# Patient Record
Sex: Female | Born: 1945 | Race: Black or African American | Hispanic: No | Marital: Married | Smoking: Never smoker
Health system: Southern US, Community
[De-identification: ages and names within clinical notes are randomized; demographics above are authoritative.]

## PROBLEM LIST (undated history)

## (undated) ENCOUNTER — Ambulatory Visit: Admission: EM | Payer: Medicare Other

## (undated) DIAGNOSIS — E119 Type 2 diabetes mellitus without complications: Secondary | ICD-10-CM

## (undated) DIAGNOSIS — I10 Essential (primary) hypertension: Secondary | ICD-10-CM

## (undated) DIAGNOSIS — H25019 Cortical age-related cataract, unspecified eye: Secondary | ICD-10-CM

## (undated) HISTORY — PX: COLONOSCOPY: SHX174

## (undated) HISTORY — PX: BREAST BIOPSY: SHX20

## (undated) HISTORY — PX: BREAST EXCISIONAL BIOPSY: SUR124

## (undated) HISTORY — PX: ABDOMINAL HYSTERECTOMY: SHX81

---

## 2004-09-06 ENCOUNTER — Ambulatory Visit: Payer: Self-pay | Admitting: Obstetrics and Gynecology

## 2005-10-30 ENCOUNTER — Ambulatory Visit: Payer: Self-pay | Admitting: Obstetrics and Gynecology

## 2005-11-14 ENCOUNTER — Ambulatory Visit: Payer: Self-pay | Admitting: Gastroenterology

## 2006-11-04 ENCOUNTER — Ambulatory Visit: Payer: Self-pay | Admitting: Obstetrics and Gynecology

## 2006-11-06 ENCOUNTER — Ambulatory Visit: Payer: Self-pay | Admitting: Obstetrics and Gynecology

## 2007-11-05 ENCOUNTER — Ambulatory Visit: Payer: Self-pay | Admitting: Obstetrics and Gynecology

## 2008-11-10 ENCOUNTER — Ambulatory Visit: Payer: Self-pay

## 2009-11-13 ENCOUNTER — Ambulatory Visit: Payer: Self-pay

## 2010-11-26 ENCOUNTER — Ambulatory Visit: Payer: Self-pay

## 2011-11-25 ENCOUNTER — Emergency Department: Payer: Self-pay | Admitting: Emergency Medicine

## 2011-11-25 LAB — COMPREHENSIVE METABOLIC PANEL
Albumin: 3.5 g/dL (ref 3.4–5.0)
Alkaline Phosphatase: 63 U/L (ref 50–136)
Anion Gap: 8 (ref 7–16)
Calcium, Total: 9.2 mg/dL (ref 8.5–10.1)
Chloride: 104 mmol/L (ref 98–107)
Co2: 30 mmol/L (ref 21–32)
Creatinine: 0.87 mg/dL (ref 0.60–1.30)
Glucose: 84 mg/dL (ref 65–99)
Osmolality: 284 (ref 275–301)
SGOT(AST): 21 U/L (ref 15–37)
SGPT (ALT): 20 U/L (ref 12–78)
Sodium: 142 mmol/L (ref 136–145)

## 2011-11-25 LAB — URINALYSIS, COMPLETE
Bilirubin,UR: NEGATIVE
Blood: NEGATIVE
Glucose,UR: NEGATIVE mg/dL (ref 0–75)
Ketone: NEGATIVE
Nitrite: NEGATIVE
Protein: NEGATIVE
RBC,UR: 5 /HPF (ref 0–5)
Specific Gravity: 1.017 (ref 1.003–1.030)
WBC UR: 43 /HPF (ref 0–5)

## 2011-11-25 LAB — CBC
HCT: 37.4 % (ref 35.0–47.0)
HGB: 12.6 g/dL (ref 12.0–16.0)
MCH: 31 pg (ref 26.0–34.0)
MCHC: 33.7 g/dL (ref 32.0–36.0)
MCV: 92 fL (ref 80–100)
RDW: 13.3 % (ref 11.5–14.5)

## 2011-11-25 LAB — CK TOTAL AND CKMB (NOT AT ARMC): CK-MB: <0.5 ng/mL — ABNORMAL LOW (ref 0.5–3.6)

## 2011-11-27 ENCOUNTER — Ambulatory Visit: Payer: Self-pay

## 2011-11-27 LAB — URINE CULTURE

## 2011-12-16 ENCOUNTER — Ambulatory Visit: Payer: Self-pay | Admitting: Physician Assistant

## 2012-01-05 ENCOUNTER — Ambulatory Visit: Payer: Self-pay | Admitting: Physician Assistant

## 2012-12-03 ENCOUNTER — Ambulatory Visit: Payer: Self-pay

## 2012-12-24 ENCOUNTER — Ambulatory Visit: Payer: Self-pay

## 2013-06-24 ENCOUNTER — Ambulatory Visit: Payer: Self-pay

## 2013-12-20 ENCOUNTER — Ambulatory Visit: Payer: Self-pay

## 2014-01-03 ENCOUNTER — Ambulatory Visit: Payer: Self-pay

## 2014-11-17 ENCOUNTER — Other Ambulatory Visit: Payer: Self-pay | Admitting: Obstetrics and Gynecology

## 2014-11-17 DIAGNOSIS — Z1231 Encounter for screening mammogram for malignant neoplasm of breast: Secondary | ICD-10-CM

## 2014-12-27 ENCOUNTER — Other Ambulatory Visit: Payer: Self-pay | Admitting: Obstetrics and Gynecology

## 2014-12-27 ENCOUNTER — Ambulatory Visit
Admission: RE | Admit: 2014-12-27 | Discharge: 2014-12-27 | Disposition: A | Discharge status: Home / Self Care | Payer: Medicare Other | Source: Ambulatory Visit | Attending: Obstetrics and Gynecology | Admitting: Obstetrics and Gynecology

## 2014-12-27 DIAGNOSIS — Z1231 Encounter for screening mammogram for malignant neoplasm of breast: Secondary | ICD-10-CM

## 2015-11-30 ENCOUNTER — Other Ambulatory Visit: Payer: Self-pay | Admitting: Obstetrics and Gynecology

## 2015-11-30 DIAGNOSIS — Z1231 Encounter for screening mammogram for malignant neoplasm of breast: Secondary | ICD-10-CM

## 2016-01-04 ENCOUNTER — Ambulatory Visit
Admission: RE | Admit: 2016-01-04 | Discharge: 2016-01-04 | Disposition: A | Discharge status: Home / Self Care | Payer: Medicare Other | Source: Ambulatory Visit | Attending: Obstetrics and Gynecology | Admitting: Obstetrics and Gynecology

## 2016-01-04 DIAGNOSIS — Z1231 Encounter for screening mammogram for malignant neoplasm of breast: Secondary | ICD-10-CM | POA: Insufficient documentation

## 2016-04-26 ENCOUNTER — Other Ambulatory Visit: Payer: Self-pay | Admitting: Family Medicine

## 2016-04-26 DIAGNOSIS — Z780 Asymptomatic menopausal state: Secondary | ICD-10-CM

## 2016-07-10 ENCOUNTER — Encounter: Payer: Self-pay | Admitting: *Deleted

## 2016-07-11 ENCOUNTER — Ambulatory Visit: Payer: Medicare Other | Admitting: Anesthesiology

## 2016-07-11 ENCOUNTER — Ambulatory Visit
Admission: RE | Admit: 2016-07-11 | Discharge: 2016-07-11 | Disposition: A | Discharge status: Home / Self Care | Payer: Medicare Other | Source: Ambulatory Visit | Attending: Gastroenterology | Admitting: Gastroenterology

## 2016-07-11 ENCOUNTER — Encounter
Admission: RE | Disposition: A | Discharge status: Home / Self Care | Payer: Self-pay | Source: Ambulatory Visit | Attending: Gastroenterology

## 2016-07-11 DIAGNOSIS — Z7982 Long term (current) use of aspirin: Secondary | ICD-10-CM | POA: Diagnosis not present

## 2016-07-11 DIAGNOSIS — Z888 Allergy status to other drugs, medicaments and biological substances status: Secondary | ICD-10-CM | POA: Insufficient documentation

## 2016-07-11 DIAGNOSIS — Z79899 Other long term (current) drug therapy: Secondary | ICD-10-CM | POA: Diagnosis not present

## 2016-07-11 DIAGNOSIS — I10 Essential (primary) hypertension: Secondary | ICD-10-CM | POA: Diagnosis not present

## 2016-07-11 DIAGNOSIS — E669 Obesity, unspecified: Secondary | ICD-10-CM | POA: Diagnosis not present

## 2016-07-11 DIAGNOSIS — Z1211 Encounter for screening for malignant neoplasm of colon: Secondary | ICD-10-CM | POA: Insufficient documentation

## 2016-07-11 DIAGNOSIS — E119 Type 2 diabetes mellitus without complications: Secondary | ICD-10-CM | POA: Insufficient documentation

## 2016-07-11 DIAGNOSIS — K5730 Diverticulosis of large intestine without perforation or abscess without bleeding: Secondary | ICD-10-CM | POA: Diagnosis not present

## 2016-07-11 DIAGNOSIS — Z882 Allergy status to sulfonamides status: Secondary | ICD-10-CM | POA: Insufficient documentation

## 2016-07-11 DIAGNOSIS — Z6831 Body mass index (BMI) 31.0-31.9, adult: Secondary | ICD-10-CM | POA: Insufficient documentation

## 2016-07-11 HISTORY — DX: Essential (primary) hypertension: I10

## 2016-07-11 HISTORY — DX: Type 2 diabetes mellitus without complications: E11.9

## 2016-07-11 HISTORY — DX: Cortical age-related cataract, unspecified eye: H25.019

## 2016-07-11 HISTORY — PX: COLONOSCOPY WITH PROPOFOL: SHX5780

## 2016-07-11 LAB — GLUCOSE, CAPILLARY: GLUCOSE-CAPILLARY: 125 mg/dL — AB (ref 65–99)

## 2016-07-11 SURGERY — COLONOSCOPY WITH PROPOFOL
Anesthesia: General

## 2016-07-11 MED ORDER — PROPOFOL 500 MG/50ML IV EMUL
INTRAVENOUS | Status: AC
  Filled 2016-07-11: qty 50

## 2016-07-11 MED ORDER — LIDOCAINE HCL (CARDIAC) 20 MG/ML IV SOLN
INTRAVENOUS | Status: DC | PRN
  Administered 2016-07-11: 2 mL via INTRAVENOUS

## 2016-07-11 MED ORDER — SODIUM CHLORIDE 0.9 % IV SOLN: INTRAVENOUS | Status: DC

## 2016-07-11 MED ORDER — PROPOFOL 500 MG/50ML IV EMUL
INTRAVENOUS | Status: DC | PRN
Start: 2016-07-11 — End: 2016-07-11
  Administered 2016-07-11: 120 ug/kg/min via INTRAVENOUS

## 2016-07-11 MED ORDER — SODIUM CHLORIDE 0.9 % IV SOLN
INTRAVENOUS | Status: DC
  Administered 2016-07-11: 1000 mL via INTRAVENOUS

## 2016-07-11 MED ORDER — PROPOFOL 10 MG/ML IV BOLUS
INTRAVENOUS | Status: DC | PRN
  Administered 2016-07-11: 20 mg via INTRAVENOUS
  Administered 2016-07-11: 40 mg via INTRAVENOUS
  Administered 2016-07-11: 20 mg via INTRAVENOUS

## 2016-07-11 NOTE — H&P (Signed)
I have discussed the risks benefits and complications of procedures to include not limited to bleeding, infection, perforation and the risk of sedation and the patient wishes to proceed. 

## 2016-07-11 NOTE — Anesthesia Post-op Follow-up Note (Signed)
Anesthesia QCDR form completed.        

## 2016-07-11 NOTE — Anesthesia Preprocedure Evaluation (Signed)
Anesthesia Evaluation  Patient identified by MRN, date of birth, ID band Patient awake    Reviewed: Allergy & Precautions, NPO status , Patient's Chart, lab work & pertinent test results  Airway Mallampati: II       Dental  (+) Teeth Intact   Pulmonary neg pulmonary ROS,    breath sounds clear to auscultation       Cardiovascular Exercise Tolerance: Good hypertension, Pt. on medications  Rhythm:Regular     Neuro/Psych negative neurological ROS  negative psych ROS   GI/Hepatic negative GI ROS, Neg liver ROS,   Endo/Other  diabetes, Type 2  Renal/GU negative Renal ROS     Musculoskeletal negative musculoskeletal ROS (+)   Abdominal (+) + obese,   Peds negative pediatric ROS (+)  Hematology negative hematology ROS (+)   Anesthesia Other Findings   Reproductive/Obstetrics                             Anesthesia Physical Anesthesia Plan  ASA: II  Anesthesia Plan: General   Post-op Pain Management:    Induction: Intravenous  PONV Risk Score and Plan: 1 and Treatment may vary due to age  Airway Management Planned: Natural Airway  Additional Equipment:   Intra-op Plan:   Post-operative Plan:   Informed Consent: I have reviewed the patients History and Physical, chart, labs and discussed the procedure including the risks, benefits and alternatives for the proposed anesthesia with the patient or authorized representative who has indicated his/her understanding and acceptance.     Plan Discussed with: Surgeon  Anesthesia Plan Comments:         Anesthesia Quick Evaluation

## 2016-07-11 NOTE — Op Note (Signed)
[facility] Gastroenterology Patient Name: Mallory Wilkerson Procedure Date: 07/11/2016 9:06 AM MRN: [id number] Account #: [id number] Date of Birth: 1945-08-27 Admit Type: Outpatient Age: 71 Room: [facility] ENDO ROOM 1 Gender: Female Note Status: Finalized Procedure:            Colonoscopy Indications:          Screening for colorectal malignant neoplasm Providers:            Christena Deem, MD Referring MD:         Nat Christen. Zada Finders, MD (Referring MD) Medicines:            Monitored Anesthesia Care Complications:        No immediate complications. Procedure:            Pre-Anesthesia Assessment:                       - ASA Grade Assessment: II - A patient with mild                        systemic disease.                       After obtaining informed consent, the colonoscope was                        passed under direct vision. Throughout the procedure,                        the patient's blood pressure, pulse, and oxygen                        saturations were monitored continuously. The                        Colonoscope was introduced through the anus and                        advanced to the the cecum, identified by appendiceal                        orifice and ileocecal valve. The colonoscopy was                        performed with moderate difficulty due to significant                        looping. Successful completion of the procedure was                        aided by changing the patient to a supine position,                        changing the patient to a prone position, using manual                        pressure and changing endoscopes. The patient tolerated                        the procedure well. The quality of the bowel  preparation was good. Findings:      Multiple small and large-mouthed diverticula were found in the sigmoid       colon, descending colon, transverse colon and ascending colon.      The digital rectal  exam was normal.      The retroflexed view of the distal rectum and anal verge was normal and       showed no anal or rectal abnormalities. Impression:           - Diverticulosis in the sigmoid colon, in the                        descending colon, in the transverse colon and in the                        ascending colon.                       - No specimens collected. Recommendation:       - Discharge patient to home.                       - Repeat colonoscopy in 10 years for screening purposes. Procedure Code(s):    --- Professional ---                       [phone], Colonoscopy, flexible; diagnostic, including                        collection of specimen(s) by brushing or washing, when                        performed (separate procedure) Diagnosis Code(s):    --- Professional ---                       Z12.11, Encounter for screening for malignant neoplasm                        of colon                       K57.30, Diverticulosis of large intestine without                        perforation or abscess without bleeding CPT copyright 2016 American [employer]. All rights reserved. The codes documented in this report are preliminary and upon coder review may  be revised to meet current compliance requirements. Christena DeemMartin U Skulskie, MD 07/11/2016 9:59:00 AM This report has been signed electronically. Number of Addenda: 0 Note Initiated On: 07/11/2016 9:06 AM Scope Withdrawal Time: 0 hours 6 minutes 31 seconds  Total Procedure Duration: 0 hours 34 minutes 40 seconds       [facility]

## 2016-07-11 NOTE — H&P (Signed)
Outpatient short stay form Pre-procedure 07/11/2016 9:14 AM Mallory Wilkerson Cincere Zorn MD  Primary Physician: Dr. Rolin BarryMario Olmedo  Reason for visit:  Colonoscopy  History of present illness:  Patient is a 71 year old female presenting today as above. She tolerated her prep prep for colonoscopy well. She does take 81 mg aspirin but has held that for several days. She takes no other aspirin products or blood thinning agents. Colonoscopy was about 10 years ago.    Current Facility-Administered Medications:  .  0.9 %  sodium chloride infusion, , Intravenous, Continuous, Mallory DeemSkulskie, Kees Idrovo U, MD, Last Rate: 20 mL/hr at 07/11/16 0803, 1,000 mL at 07/11/16 0803 .  0.9 %  sodium chloride infusion, , Intravenous, Continuous, Mallory DeemSkulskie, Tamanika Heiney U, MD  Prescriptions Prior to Admission  Medication Sig Dispense Refill Last Dose  . amLODipine (NORVASC) 5 MG tablet Take 5 mg by mouth daily.   07/11/2016 at 0600  . aspirin EC 81 MG tablet Take 81 mg by mouth daily.   Past Week at Unknown time  . calcium citrate-vitamin D (CITRACAL+D) 315-200 MG-UNIT tablet Take 1 tablet by mouth daily.   Past Week at Unknown time  . chlorthalidone (HYGROTON) 25 MG tablet Take 25 mg by mouth daily.   07/11/2016 at 0600  . ibuprofen (ADVIL,MOTRIN) 200 MG tablet Take 200 mg by mouth every 6 (six) hours as needed.   Past Week at Unknown time  . losartan (COZAAR) 50 MG tablet Take 50 mg by mouth daily.   07/11/2016 at 0600  . magnesium oxide (MAG-OX) 400 MG tablet Take 400 mg by mouth daily.   Past Week at Unknown time  . Multiple Vitamin (MULTIVITAMIN) tablet Take 1 tablet by mouth daily.   Past Week at Unknown time  . potassium chloride SA (K-DUR,KLOR-CON) 20 MEQ tablet Take 20 mEq by mouth 2 (two) times daily.     Marland Kitchen. ammonium lactate (AMLACTIN) 12 % cream Apply topically 2 (two) times daily.   Not Taking at Unknown time  . levalbuterol (XOPENEX HFA) 45 MCG/ACT inhaler Inhale 2 puffs into the lungs every 4 (four) hours as needed for wheezing.   Not  Taking at Unknown time     Allergies  Allergen Reactions  . Statins     hmg-coa reductase inhibitors  . Sulfur      Past Medical History:  Diagnosis Date  . Cataract cortical, senile   . Diabetes mellitus without complication (HCC)   . Hypertension     Review of systems:      Physical Exam    Heart and lungs: Regular rate and rhythm without rub or gallop, lungs are bilaterally clear.    HEENT: Norm cephalic atraumatic eyes are anicteric    Other:     Pertinant exam for procedure: Soft nontender nondistended bowel sounds positive normoactive.    Planned proceedures: Colonoscopy and indicated procedures. I have discussed the risks benefits and complications of procedures to include not limited to bleeding, infection, perforation and the risk of sedation and the patient wishes to proceed.    Mallory Wilkerson Mallory Mitchum, MD Gastroenterology 07/11/2016  9:14 AM

## 2016-07-11 NOTE — Transfer of Care (Signed)
Immediate Anesthesia Transfer of Care Note  Patient: Nobie PutnamDiane Crisp Lueras  Procedure(s) Performed: Procedure(s): COLONOSCOPY WITH PROPOFOL (N/A)  Patient Location: PACU  Anesthesia Type:General  Level of Consciousness: awake  Airway & Oxygen Therapy: Patient Spontanous Breathing and Patient connected to nasal cannula oxygen  Post-op Assessment: Report given to RN and Post -op Vital signs reviewed and stable  Post vital signs: Reviewed  Last Vitals:  Vitals:   07/11/16 0754  BP: (!) 129/59  Pulse: 91  Resp: 16  Temp: 36.3 C    Last Pain:  Vitals:   07/11/16 0754  TempSrc: Tympanic         Complications: No apparent anesthesia complications

## 2016-07-11 NOTE — Anesthesia Postprocedure Evaluation (Signed)
Anesthesia Post Note  Patient: Mallory PutnamDiane Crisp Wilkerson  Procedure(s) Performed: Procedure(s) (LRB): COLONOSCOPY WITH PROPOFOL (N/A)  Patient location during evaluation: PACU Anesthesia Type: General Level of consciousness: awake Pain management: pain level controlled Vital Signs Assessment: post-procedure vital signs reviewed and stable Cardiovascular status: stable Anesthetic complications: no     Last Vitals:  Vitals:   07/11/16 1020 07/11/16 1030  BP: 124/68 138/67  Pulse: 74 74  Resp: 17 16  Temp:      Last Pain:  Vitals:   07/11/16 0959  TempSrc: Tympanic                 VAN STAVEREN,[name]

## 2016-07-12 ENCOUNTER — Encounter: Payer: Self-pay | Admitting: Gastroenterology

## 2016-12-02 ENCOUNTER — Other Ambulatory Visit: Payer: Self-pay | Admitting: Family Medicine

## 2016-12-02 DIAGNOSIS — Z1231 Encounter for screening mammogram for malignant neoplasm of breast: Secondary | ICD-10-CM

## 2017-01-06 ENCOUNTER — Ambulatory Visit
Admission: RE | Admit: 2017-01-06 | Discharge: 2017-01-06 | Disposition: A | Discharge status: Home / Self Care | Payer: Medicare Other | Source: Ambulatory Visit | Attending: Family Medicine | Admitting: Family Medicine

## 2017-01-06 DIAGNOSIS — Z1231 Encounter for screening mammogram for malignant neoplasm of breast: Secondary | ICD-10-CM

## 2017-12-02 ENCOUNTER — Other Ambulatory Visit: Payer: Self-pay | Admitting: Family Medicine

## 2017-12-02 DIAGNOSIS — Z1231 Encounter for screening mammogram for malignant neoplasm of breast: Secondary | ICD-10-CM

## 2018-01-13 ENCOUNTER — Ambulatory Visit
Admission: RE | Admit: 2018-01-13 | Discharge: 2018-01-13 | Disposition: A | Discharge status: Home / Self Care | Payer: Medicare Other | Source: Ambulatory Visit | Attending: Family Medicine | Admitting: Family Medicine

## 2018-01-13 DIAGNOSIS — Z1231 Encounter for screening mammogram for malignant neoplasm of breast: Secondary | ICD-10-CM | POA: Diagnosis present

## 2018-03-04 ENCOUNTER — Other Ambulatory Visit: Payer: Self-pay | Admitting: Family Medicine

## 2018-03-04 DIAGNOSIS — Z780 Asymptomatic menopausal state: Secondary | ICD-10-CM

## 2018-12-14 ENCOUNTER — Other Ambulatory Visit: Payer: Self-pay | Admitting: Family Medicine

## 2018-12-14 DIAGNOSIS — Z1231 Encounter for screening mammogram for malignant neoplasm of breast: Secondary | ICD-10-CM

## 2019-01-19 ENCOUNTER — Ambulatory Visit
Admission: RE | Admit: 2019-01-19 | Discharge: 2019-01-19 | Disposition: A | Discharge status: Home / Self Care | Payer: Medicare Other | Source: Ambulatory Visit | Attending: Family Medicine | Admitting: Family Medicine

## 2019-01-19 DIAGNOSIS — Z780 Asymptomatic menopausal state: Secondary | ICD-10-CM | POA: Insufficient documentation

## 2019-01-19 DIAGNOSIS — Z1231 Encounter for screening mammogram for malignant neoplasm of breast: Secondary | ICD-10-CM | POA: Insufficient documentation

## 2019-04-29 IMAGING — MG DIGITAL SCREENING BILATERAL MAMMOGRAM WITH TOMO AND CAD
8 series · 8 of 24 positions shown · non-contrast
Comparison: Previous exam(s).

CLINICAL DATA: Screening.

EXAM:
DIGITAL SCREENING BILATERAL MAMMOGRAM WITH TOMO AND CAD

[L CC synth-2D]
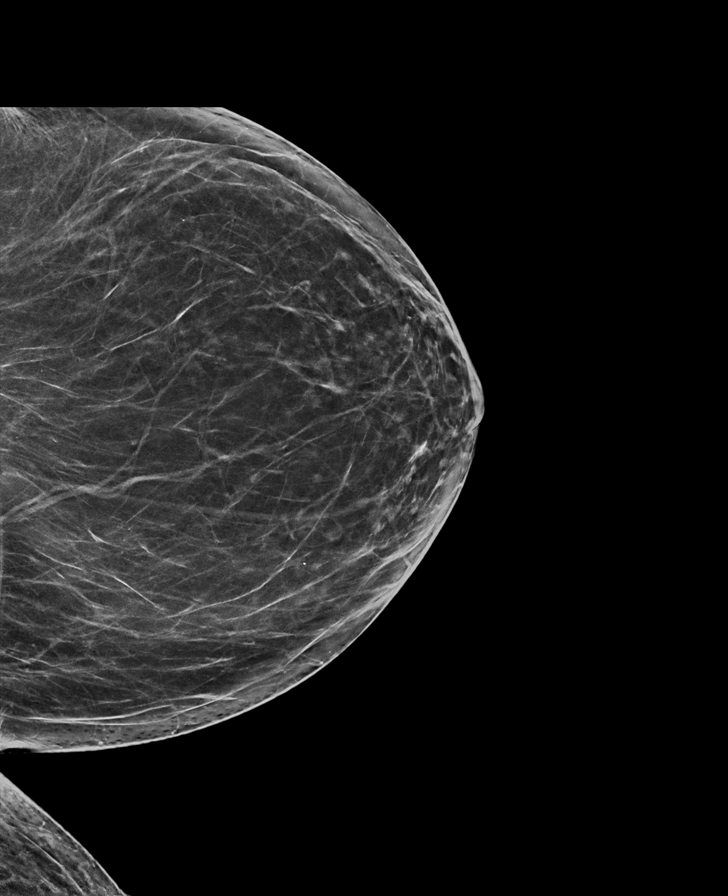

[R MLO synth-2D]
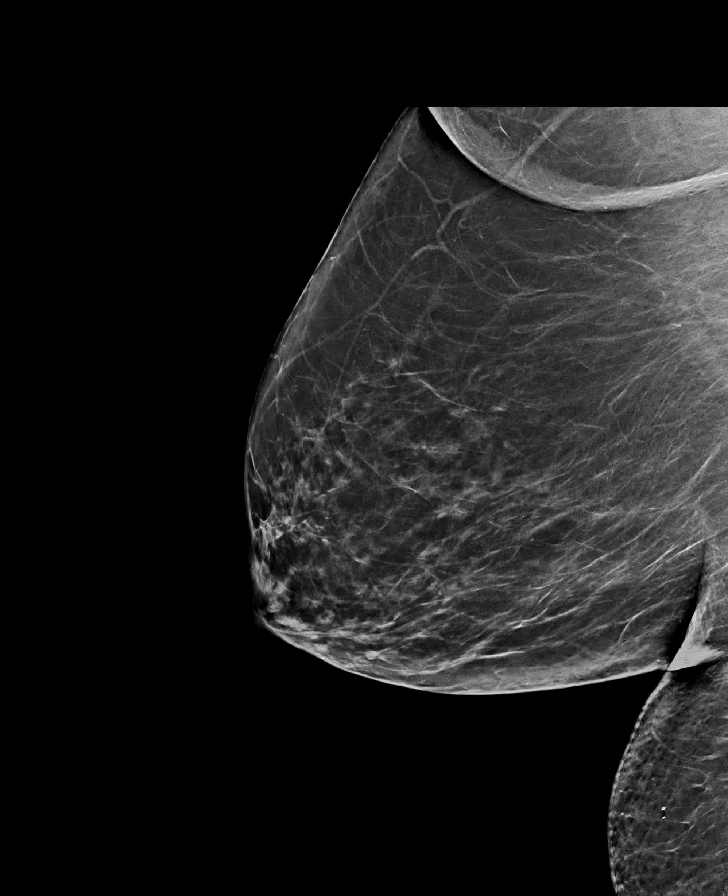

[L MLO synth-2D]
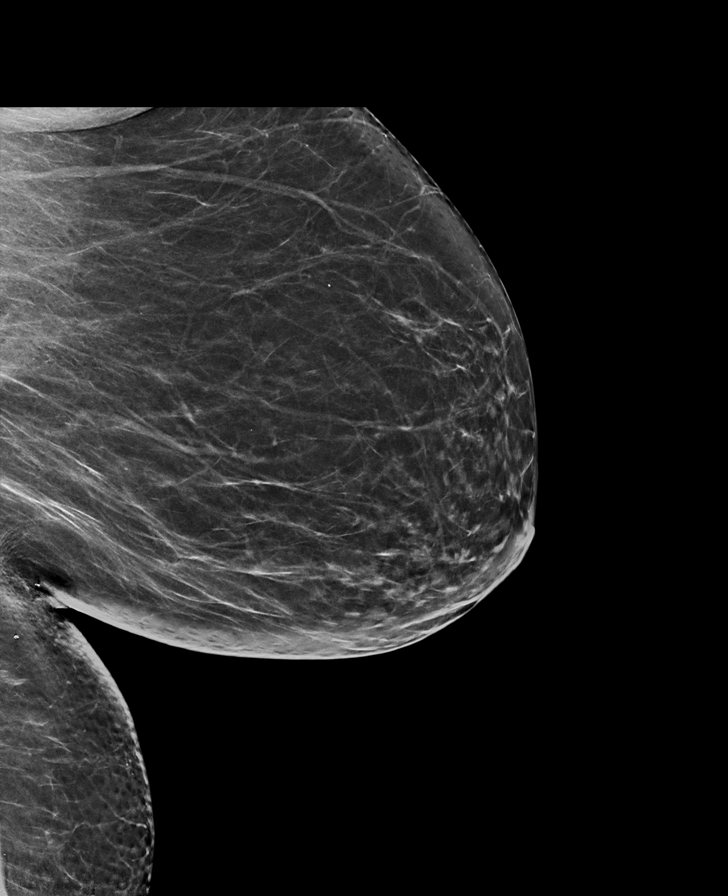

[R CC synth-2D]
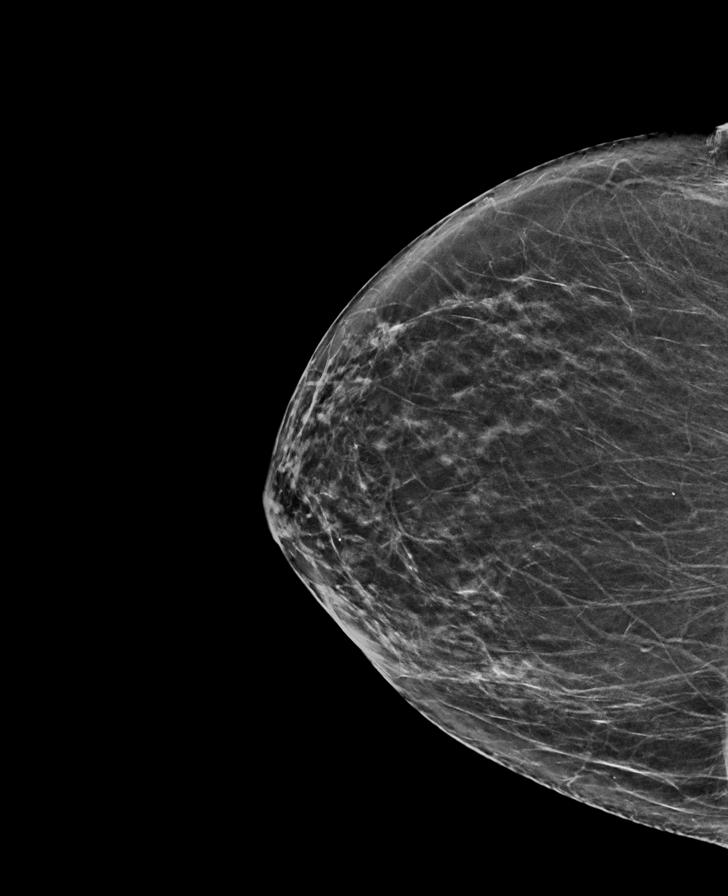

[L CC tomo · tomo slice 34/67.0]
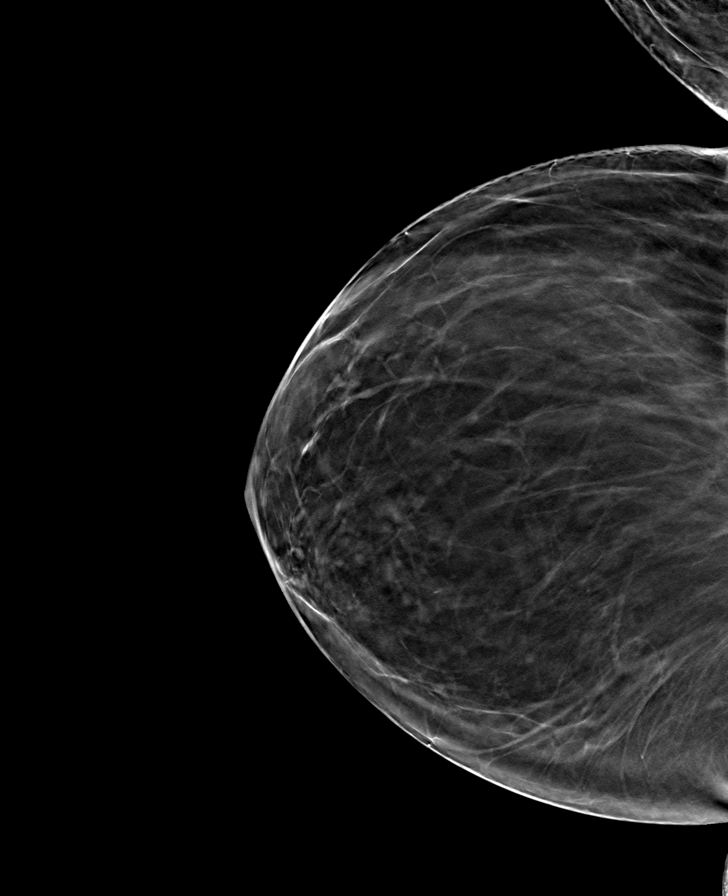

[R MLO tomo · tomo slice 39/76.0]
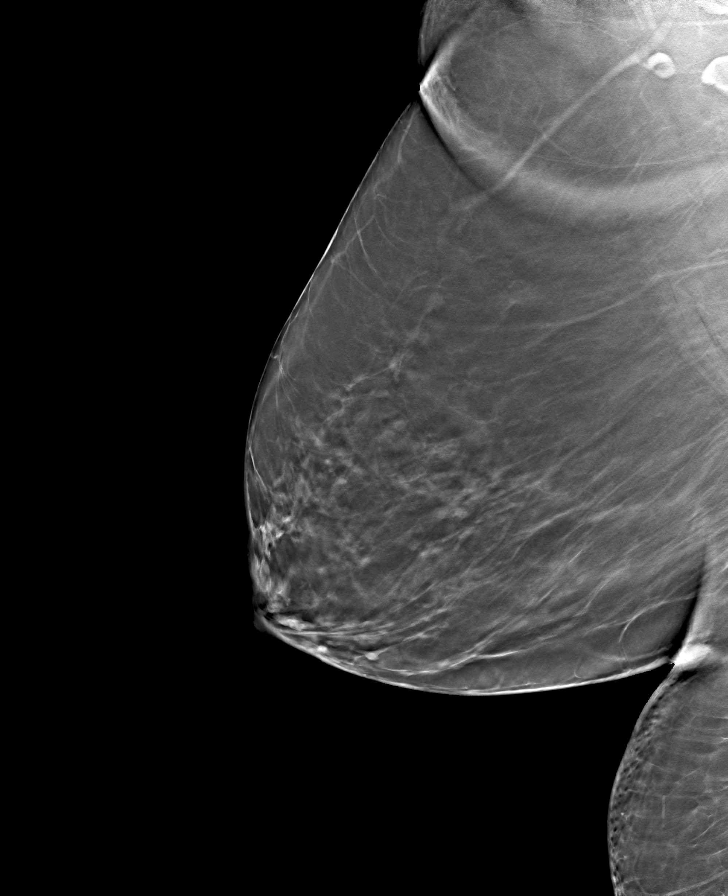

[L MLO tomo · tomo slice 37/72.0]
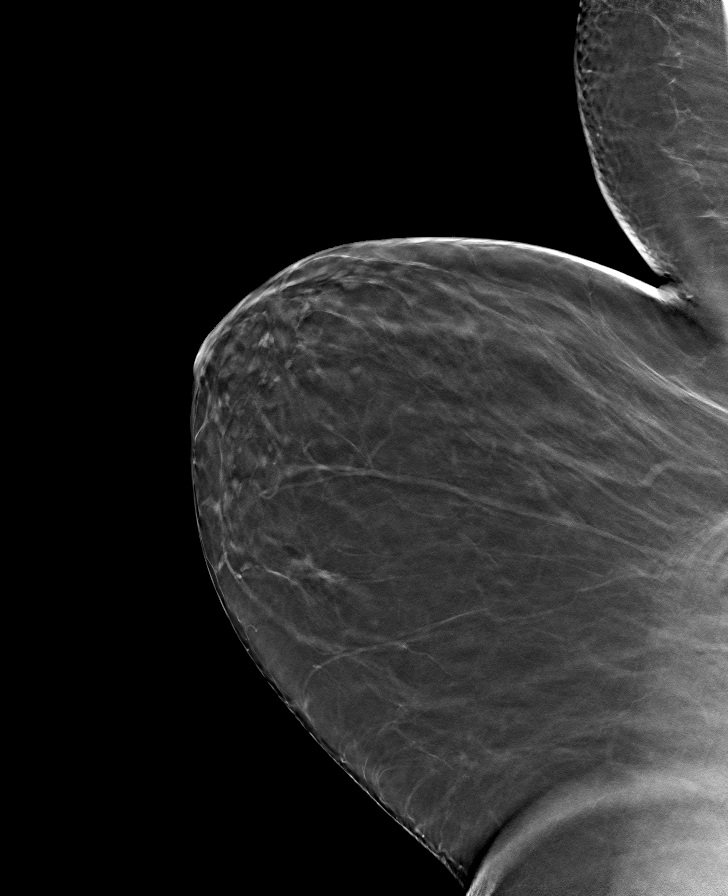

[R CC tomo · tomo slice 31/62.0]
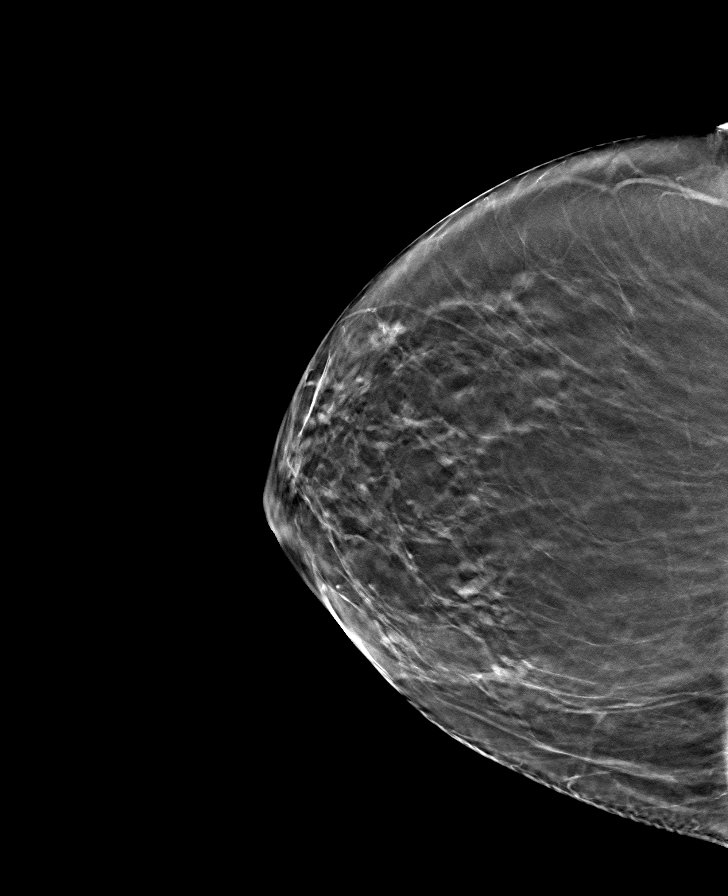

[8 of 24 positions shown; findings below may reference images not displayed]

ACR Breast Density Category b: There are scattered areas of
fibroglandular density.
FINDINGS: There are no findings suspicious for malignancy. Images were
processed with CAD.
IMPRESSION: No mammographic evidence of malignancy. A result letter of this
screening mammogram will be mailed directly to the patient.

RECOMMENDATION:
Screening mammogram in one year. (Code:CN-U-775)

BI-RADS CATEGORY  1: Negative.

## 2019-12-14 ENCOUNTER — Other Ambulatory Visit: Payer: Self-pay | Admitting: Family Medicine

## 2019-12-14 DIAGNOSIS — Z1231 Encounter for screening mammogram for malignant neoplasm of breast: Secondary | ICD-10-CM

## 2020-01-25 ENCOUNTER — Other Ambulatory Visit: Payer: Self-pay

## 2020-01-25 ENCOUNTER — Ambulatory Visit
Admission: RE | Admit: 2020-01-25 | Discharge: 2020-01-25 | Disposition: A | Discharge status: Home / Self Care | Payer: Medicare Other | Source: Ambulatory Visit | Attending: Family Medicine | Admitting: Family Medicine

## 2020-01-25 DIAGNOSIS — Z1231 Encounter for screening mammogram for malignant neoplasm of breast: Secondary | ICD-10-CM | POA: Diagnosis not present

## 2020-12-01 ENCOUNTER — Other Ambulatory Visit: Payer: Self-pay | Admitting: Family Medicine

## 2020-12-01 DIAGNOSIS — Z1231 Encounter for screening mammogram for malignant neoplasm of breast: Secondary | ICD-10-CM

## 2021-01-25 ENCOUNTER — Ambulatory Visit
Admission: RE | Admit: 2021-01-25 | Discharge: 2021-01-25 | Disposition: A | Discharge status: Home / Self Care | Payer: Medicare Other | Source: Ambulatory Visit | Attending: Family Medicine | Admitting: Family Medicine

## 2021-01-25 ENCOUNTER — Other Ambulatory Visit: Payer: Self-pay

## 2021-01-25 DIAGNOSIS — Z1231 Encounter for screening mammogram for malignant neoplasm of breast: Secondary | ICD-10-CM | POA: Insufficient documentation

## 2021-11-28 ENCOUNTER — Ambulatory Visit
Admission: EM | Admit: 2021-11-28 | Discharge: 2021-11-28 | Disposition: A | Discharge status: Home / Self Care | Payer: Medicare Other

## 2021-11-28 DIAGNOSIS — M546 Pain in thoracic spine: Secondary | ICD-10-CM | POA: Diagnosis not present

## 2021-11-28 NOTE — ED Triage Notes (Signed)
Pt states last night she had a sharp pain mid upper back & kept her up, pt states she has been using a heating pad, pt states she did pick up a heavy box of candles yesterday morning

## 2021-11-28 NOTE — ED Provider Notes (Signed)
[facility]    CSN: [id number] Arrival date & time: 11/28/21  1624      History   Chief Complaint Chief Complaint  Patient presents with   Back Pain    HPI Mallory Wilkerson is a 76 y.o. female.   76 year old female presents with left to mid thoracic back pain that started yesterday evening. She did pull down a heavy box from a closet yesterday but did not have any immediate pain. Later that evening when she was trying to sleep, she started experiencing acute sharp pains in central to left side of back- mainly with movement. She applied a heating pad and took Tylenol with some relief. Today the pain was better this morning but returned mid-day. Discussed symptoms with her daughter who is a nurse who recommended she come to Urgent Care to be evaluated. She did have a visit with her Cardiologist this morning with no change in medication or concerns. Has history of HTN and hyperlipidemia. Currently on Norvasc, Losartan, Chlorthalidone, aspirin, Potassium and Crestor daily. Blood pressure has been stable with these medications. No previous injury to her back. No chronic pain syndromes.   The history is provided by the patient.    Past Medical History:  Diagnosis Date   Cataract cortical, senile    Diabetes mellitus without complication ([place])    Hypertension     There are no problems to display for this patient.   Past Surgical History:  Procedure Laterality Date   ABDOMINAL HYSTERECTOMY     BREAST BIOPSY Left 30+ yrs ago   EXCISIONAL - NEG   BREAST EXCISIONAL BIOPSY     COLONOSCOPY     COLONOSCOPY WITH PROPOFOL N/A 07/11/2016   Procedure: COLONOSCOPY WITH PROPOFOL;  Surgeon: Lollie Sails, MD;  Location: [facility] ENDOSCOPY;  Service: Endoscopy;  Laterality: N/A;    OB History   No obstetric history on file.      Home Medications    Prior to Admission medications   Medication Sig Start Date End Date Taking? Authorizing Provider  amLODipine (NORVASC) 5 MG  tablet Take 5 mg by mouth daily.   Yes [provider]  aspirin EC 81 MG tablet Take 81 mg by mouth daily.   Yes [provider]  calcium citrate-vitamin D (CITRACAL+D) 315-200 MG-UNIT tablet Take 1 tablet by mouth daily.   Yes [provider]  chlorthalidone (HYGROTON) 25 MG tablet Take 25 mg by mouth daily.   Yes [provider]  ibuprofen (ADVIL,MOTRIN) 200 MG tablet Take 200 mg by mouth every 6 (six) hours as needed.   Yes [provider]  losartan (COZAAR) 50 MG tablet Take 50 mg by mouth daily.   Yes [provider]  magnesium oxide (MAG-OX) 400 MG tablet Take 400 mg by mouth daily.   Yes [provider]  Multiple Vitamin (MULTIVITAMIN) tablet Take 1 tablet by mouth daily.   Yes [provider]  potassium chloride SA (K-DUR,KLOR-CON) 20 MEQ tablet Take 20 mEq by mouth 2 (two) times daily.   Yes [provider]  rosuvastatin (CRESTOR) 5 MG tablet Take by mouth. 11/28/21  Yes [provider]  ammonium lactate (AMLACTIN) 12 % cream Apply topically 2 (two) times daily.    [provider]  levalbuterol Penne Lash HFA) 45 MCG/ACT inhaler Inhale 2 puffs into the lungs every 4 (four) hours as needed for wheezing.    [provider]    Family History Family History  Problem Relation Age  of Onset   Breast cancer Neg Hx     Social History Social History   Tobacco Use   Smoking status: Never   Smokeless tobacco: Never  Substance Use Topics   Alcohol use: Yes    Alcohol/week: 1.0 standard drink of alcohol    Types: 1 Glasses of wine per week     Allergies   Elemental sulfur and Statins   Review of Systems Review of Systems  Constitutional:  Negative for activity change, appetite change, chills, diaphoresis, fatigue and fever.  HENT:  Negative for sore throat and trouble swallowing.   Respiratory:  Negative for cough, chest tightness, shortness of breath and wheezing.    Cardiovascular:  Negative for chest pain and palpitations.  Gastrointestinal:  Negative for nausea and vomiting.  Musculoskeletal:  Positive for back pain and myalgias. Negative for neck pain and neck stiffness.  Skin:  Negative for color change and rash.  Allergic/Immunologic: Positive for environmental allergies (in past but not this year). Negative for food allergies and immunocompromised state.  Neurological:  Negative for dizziness, tremors, seizures, syncope, speech difficulty, weakness, light-headedness and numbness.  Hematological:  Negative for adenopathy. Does not bruise/bleed easily.     Physical Exam Triage Vital Signs ED Triage Vitals  Enc Vitals Group     BP 11/28/21 1642 (!) 148/80     Pulse Rate 11/28/21 1642 90     Resp --      Temp 11/28/21 1642 98.3 F (36.8 C)     Temp Source 11/28/21 1642 Oral     SpO2 11/28/21 1642 98 %     Weight 11/28/21 1639 160 lb (72.6 kg)     Height 11/28/21 1639 5' 2.5" (1.588 m)     Head Circumference --      Peak Flow --      Pain Score 11/28/21 1639 5     Pain Loc --      Pain Edu? --      Excl. in GC? --    No data found.  Updated Vital Signs BP (!) 148/80 (BP Location: Left Arm)   Pulse 90   Temp 98.3 F (36.8 C) (Oral)   Ht 5' 2.5" (1.588 m)   Wt 160 lb (72.6 kg)   SpO2 98%   BMI 28.80 kg/m   Visual Acuity Right Eye Distance:   Left Eye Distance:   Bilateral Distance:    Right Eye Near:   Left Eye Near:    Bilateral Near:     Physical Exam Vitals and nursing note reviewed.  Constitutional:      General: She is awake. She is not in acute distress.    Appearance: She is well-developed and well-groomed.     Comments: She is sitting comfortably in the exam chair but does complain of pain in back with changing positions, mainly with moving left upper arm/shoulder.   HENT:     Head: Normocephalic and atraumatic.     Right Ear: Hearing normal.     Left Ear: Hearing normal.  Eyes:     Extraocular Movements:  Extraocular movements intact.     Conjunctiva/sclera: Conjunctivae normal.  Cardiovascular:     Rate and Rhythm: Normal rate and regular rhythm.     Heart sounds: Normal heart sounds. No murmur heard. Pulmonary:     Effort: Pulmonary effort is normal.     Breath sounds: Normal breath sounds and air entry.  Musculoskeletal:        General: Tenderness present.  Normal range of motion.     Cervical back: Normal, normal range of motion and neck supple. No rigidity or tenderness. Normal range of motion.     Thoracic back: Tenderness present. No swelling, signs of trauma or spasms. Normal range of motion.     Lumbar back: Normal.       Back:     Comments: Pain in midline to slightly left thoracic area of back with movement of left arm/shoulder. No distinct swelling. No rash. Slightly tender to palpation of scapular muscle group. No distinct spasms present. No neuro deficits noted.   Lymphadenopathy:     Cervical: No cervical adenopathy.  Skin:    General: Skin is warm and dry.     Capillary Refill: Capillary refill takes less than 2 seconds.     Findings: No rash.  Neurological:     General: No focal deficit present.     Mental Status: She is alert and oriented to person, place, and time.     Cranial Nerves: Cranial nerves 2-12 are intact.     Sensory: Sensation is intact. No sensory deficit.     Motor: Motor function is intact.     Gait: Gait is intact.     Deep Tendon Reflexes: Reflexes are normal and symmetric.  Psychiatric:        Mood and Affect: Mood normal.        Behavior: Behavior normal. Behavior is cooperative.        Thought Content: Thought content normal.        Judgment: Judgment normal.      UC Treatments / Results  Labs (all labs ordered are listed, but only abnormal results are displayed) Labs Reviewed - No data to display  EKG   Radiology No results found.  Procedures Procedures (including critical care time)  Medications Ordered in UC Medications -  No data to display  Initial Impression / Assessment and Plan / UC Course  I have reviewed the triage vital signs and the nursing notes.  Pertinent labs & imaging results that were available during my care of the patient were reviewed by me and considered in my medical decision making (see chart for details).     Reviewed with patient that she appears to have a mild thoracic muscle strain- most likely from lifting heavier box yesterday. Discussed that pain may come and go over the next few days but should get better. May continue heating pad/warm compresses to area as needed for comfort. May continue OTC Tylenol 1000mg  every 8 hours as needed for pain. If pain is not controlled by Tylenol, may take Ibuprofen 400-600mg  every 8 hours as needed but can raise blood pressure so monitor. Continue to monitor pain and symptoms. If pain gets more severe with any shortness of breath, go to the ER ASAP. Otherwise, follow-up with her Primary Care Provider as planned.  Final Clinical Impressions(s) / UC Diagnoses   Final diagnoses:  Acute left-sided thoracic back pain     Discharge Instructions      Recommend continue to apply warm compresses/heating pad to area for comfort. May continue OTC Tylenol 1000mg  every 8 hours as needed for pain. May also take Ibuprofen 400-600mg  every 8 hours as needed for pain that is not controlled by Tylenol. Continue to monitor symptoms. If pain gets more severe with any shortness of breath, go to the ER ASAP. Otherwise, follow-up with your Primary Care provider as planned.     ED Prescriptions   None  PDMP not reviewed this encounter.   Sudie Grumbling, NP 11/29/21 [phone]

## 2021-11-28 NOTE — Discharge Instructions (Addendum)
Recommend continue to apply warm compresses/heating pad to area for comfort. May continue OTC Tylenol 1000mg  every 8 hours as needed for pain. May also take Ibuprofen 400-600mg  every 8 hours as needed for pain that is not controlled by Tylenol. Continue to monitor symptoms. If pain gets more severe with any shortness of breath, go to the ER ASAP. Otherwise, follow-up with your Primary Care provider as planned.

## 2021-12-25 ENCOUNTER — Other Ambulatory Visit: Payer: Self-pay | Admitting: Family Medicine

## 2021-12-25 DIAGNOSIS — Z1231 Encounter for screening mammogram for malignant neoplasm of breast: Secondary | ICD-10-CM

## 2022-02-01 ENCOUNTER — Ambulatory Visit
Admission: RE | Admit: 2022-02-01 | Discharge: 2022-02-01 | Disposition: A | Discharge status: Home / Self Care | Payer: Medicare Other | Source: Ambulatory Visit | Attending: Family Medicine | Admitting: Family Medicine

## 2022-02-01 DIAGNOSIS — Z1231 Encounter for screening mammogram for malignant neoplasm of breast: Secondary | ICD-10-CM | POA: Insufficient documentation

## 2022-08-05 ENCOUNTER — Encounter: Payer: Self-pay | Admitting: Emergency Medicine

## 2022-08-05 ENCOUNTER — Ambulatory Visit
Admission: EM | Admit: 2022-08-05 | Discharge: 2022-08-05 | Disposition: A | Discharge status: Home / Self Care | Payer: Medicare Other

## 2022-08-05 DIAGNOSIS — S161XXA Strain of muscle, fascia and tendon at neck level, initial encounter: Secondary | ICD-10-CM

## 2022-08-05 DIAGNOSIS — M542 Cervicalgia: Secondary | ICD-10-CM

## 2022-08-05 MED ORDER — BACLOFEN 10 MG PO TABS: 10.0000 mg | ORAL_TABLET | Freq: Two times a day (BID) | ORAL | 0 refills | Status: DC | PRN

## 2022-08-05 MED ORDER — MELOXICAM 7.5 MG PO TABS: 7.5000 mg | ORAL_TABLET | Freq: Every day | ORAL | 0 refills | Status: DC | PRN

## 2022-08-05 MED ORDER — KETOROLAC TROMETHAMINE 60 MG/2ML IM SOLN
30.0000 mg | Freq: Once | INTRAMUSCULAR | Status: AC
  Administered 2022-08-05: 30 mg via INTRAMUSCULAR

## 2022-08-05 NOTE — Discharge Instructions (Signed)
NECK PAIN: You are having neck muscle pain. You were given ketorolac today. Can continue Tylenol today and start the baclofen muscle relaxer. Wait until tomorrow to start meloxicam if needed. Stressed avoiding painful activities. This can exacerbate your symptoms and make them worse.  May apply heat to the areas of pain for some relief. Use medications as directed. Be aware of which medications make you drowsy and do not drive or operate any kind of heavy machinery while using the medication (ie pain medications or muscle relaxers). F/U with PCP for reexamination or return sooner if condition worsens or does not begin to improve over the next few days.   NECK PAIN RED FLAGS: If symptoms get worse than they are right now, you should come back sooner for re-evaluation. If you have increased numbness/ tingling or notice that the numbness/tingling is affecting the legs or saddle region, go to ER. If you ever lose continence go to ER.

## 2022-08-05 NOTE — ED Provider Notes (Signed)
[facility]    CSN: [id number] Arrival date & time: 08/05/22  1517      History   Chief Complaint Chief Complaint  Patient presents with   Neck Pain    HPI Mallory Wilkerson is a 77 y.o. female presenting for approximately 5-day history of left-sided neck pain.  Patient reports increased pain when rotating neck to the left side and when extending neck.  No significant pain when not turning neck.  No chest pain, palpitations or shortness of breath.  No injury.  Patient reports losing her husband not too long ago and says she has been more physical than she is used to being.  She has been taking Tylenol which has been slightly helpful for discomfort but has not taken the pain away.  Patient reports similar symptoms at the end of last year and says she got better with anti-inflammatory medication but has not recently tried that.  No other complaints.  HPI  Past Medical History:  Diagnosis Date   Cataract cortical, senile    Diabetes mellitus without complication (HCC)    Hypertension     There are no problems to display for this patient.   Past Surgical History:  Procedure Laterality Date   ABDOMINAL HYSTERECTOMY     BREAST BIOPSY Left 30+ yrs ago   EXCISIONAL - NEG   BREAST EXCISIONAL BIOPSY     COLONOSCOPY     COLONOSCOPY WITH PROPOFOL N/A 07/11/2016   Procedure: COLONOSCOPY WITH PROPOFOL;  Surgeon: Christena Deem, MD;  Location: [facility] ENDOSCOPY;  Service: Endoscopy;  Laterality: N/A;    OB History   No obstetric history on file.      Home Medications    Prior to Admission medications   Medication Sig Start Date End Date Taking? Authorizing Provider  baclofen (LIORESAL) 10 MG tablet Take 1 tablet (10 mg total) by mouth 2 (two) times daily as needed for muscle spasms. 08/05/22  Yes Shirlee Latch, PA-C  meloxicam (MOBIC) 7.5 MG tablet Take 1 tablet (7.5 mg total) by mouth daily as needed for pain. 08/05/22  Yes Eusebio Friendly B, PA-C  amLODipine (NORVASC) 5  MG tablet Take 5 mg by mouth daily.    [provider]  ammonium lactate (AMLACTIN) 12 % cream Apply topically 2 (two) times daily.    [provider]  aspirin EC 81 MG tablet Take 81 mg by mouth daily.    [provider]  calcium citrate-vitamin D (CITRACAL+D) 315-200 MG-UNIT tablet Take 1 tablet by mouth daily.    [provider]  chlorthalidone (HYGROTON) 25 MG tablet Take 25 mg by mouth daily.    [provider]  levalbuterol Pauline Aus HFA) 45 MCG/ACT inhaler Inhale 2 puffs into the lungs every 4 (four) hours as needed for wheezing.    [provider]  losartan (COZAAR) 50 MG tablet Take 50 mg by mouth daily.    [provider]  magnesium oxide (MAG-OX) 400 MG tablet Take 400 mg by mouth daily.    [provider]  Multiple Vitamin (MULTIVITAMIN) tablet Take 1 tablet by mouth daily.    [provider]  potassium chloride SA (K-DUR,KLOR-CON) 20 MEQ tablet Take 20 mEq by mouth 2 (two) times daily.    [provider]  rosuvastatin (CRESTOR) 5 MG tablet Take by mouth. 11/28/21   [provider]    Family History Family History  Problem Relation Age of Onset   Breast cancer Neg Hx  Social History Social History   Tobacco Use   Smoking status: Never   Smokeless tobacco: Never  Vaping Use   Vaping Use: Never used  Substance Use Topics   Alcohol use: Yes    Alcohol/week: 1.0 standard drink of alcohol    Types: 1 Glasses of wine per week     Allergies   Elemental sulfur and Statins   Review of Systems Review of Systems  Constitutional:  Negative for fatigue and fever.  HENT:  Negative for congestion.   Respiratory:  Negative for cough.   Musculoskeletal:  Positive for neck pain and neck stiffness. Negative for back pain.  Neurological:  Negative for dizziness, weakness, numbness and headaches.     Physical Exam Triage Vital Signs ED Triage Vitals  Enc Vitals Group     BP       Pulse      Resp      Temp      Temp src      SpO2      Weight      Height      Head Circumference      Peak Flow      Pain Score      Pain Loc      Pain Edu?      Excl. in GC?    No data found.  Updated Vital Signs BP (!) 152/77 (BP Location: Right Arm)   Pulse 90   Temp 98.3 F (36.8 C) (Oral)   Resp 16   SpO2 95%     Physical Exam Vitals and nursing note reviewed.  Constitutional:      General: She is not in acute distress.    Appearance: Normal appearance. She is not ill-appearing or toxic-appearing.  HENT:     Head: Normocephalic and atraumatic.     Nose: Nose normal.     Mouth/Throat:     Mouth: Mucous membranes are moist.     Pharynx: Oropharynx is clear.  Eyes:     General: No scleral icterus.       Right eye: No discharge.        Left eye: No discharge.     Conjunctiva/sclera: Conjunctivae normal.  Cardiovascular:     Rate and Rhythm: Normal rate and regular rhythm.     Heart sounds: Normal heart sounds.  Pulmonary:     Effort: Pulmonary effort is normal. No respiratory distress.     Breath sounds: Normal breath sounds.  Musculoskeletal:     Cervical back: Neck supple. Tenderness (left paracervical muscles) present. No bony tenderness. Pain with movement present. Decreased range of motion.  Skin:    General: Skin is dry.  Neurological:     General: No focal deficit present.     Mental Status: She is alert. Mental status is at baseline.     Motor: No weakness.     Gait: Gait normal.  Psychiatric:        Mood and Affect: Mood normal.        Behavior: Behavior normal.        Thought Content: Thought content normal.      UC Treatments / Results  Labs (all labs ordered are listed, but only abnormal results are displayed) Labs Reviewed - No data to display  EKG   Radiology No results found.  Procedures Procedures (including critical care time)  Medications Ordered in UC Medications  ketorolac (TORADOL) injection 30 mg (30 mg  Intramuscular Given 08/05/22 1632)  Initial Impression / Assessment and Plan / UC Course  I have reviewed the triage vital signs and the nursing notes.  Pertinent labs & imaging results that were available during my care of the patient were reviewed by me and considered in my medical decision making (see chart for details).   77 year old female presents for left-sided neck pain for the past 5 days.  Denies injury.  She has taken Tylenol which has helped a little.  On exam she has slight tenderness of the paracervical muscles but no spinal tenderness.  Slightly reduced range of motion especially with rotation to the left and extension.  Presentation today consistent with cervical strain and likely muscle spasms.  Advised to continue Tylenol, ice, heat.  Sent baclofen to pharmacy.  Patient given 30 mg IM ketorolac in clinic for acute headache pain relief.  Sent meloxicam for her to start tomorrow as needed for neck pain.  Reviewed return and ER precautions.   Final Clinical Impressions(s) / UC Diagnoses   Final diagnoses:  Neck pain  Acute strain of neck muscle, initial encounter     Discharge Instructions      NECK PAIN: You are having neck muscle pain. You were given ketorolac today. Can continue Tylenol today and start the baclofen muscle relaxer. Wait until tomorrow to start meloxicam if needed. Stressed avoiding painful activities. This can exacerbate your symptoms and make them worse.  May apply heat to the areas of pain for some relief. Use medications as directed. Be aware of which medications make you drowsy and do not drive or operate any kind of heavy machinery while using the medication (ie pain medications or muscle relaxers). F/U with PCP for reexamination or return sooner if condition worsens or does not begin to improve over the next few days.   NECK PAIN RED FLAGS: If symptoms get worse than they are right now, you should come back sooner for re-evaluation. If you have  increased numbness/ tingling or notice that the numbness/tingling is affecting the legs or saddle region, go to ER. If you ever lose continence go to ER.         ED Prescriptions     Medication Sig Dispense Auth. Provider   meloxicam (MOBIC) 7.5 MG tablet Take 1 tablet (7.5 mg total) by mouth daily as needed for pain. 10 tablet Eusebio Friendly B, PA-C   baclofen (LIORESAL) 10 MG tablet Take 1 tablet (10 mg total) by mouth 2 (two) times daily as needed for muscle spasms. 14 each Shirlee Latch, PA-C      I have reviewed the PDMP during this encounter.   Shirlee Latch, PA-C 08/05/22 [phone]

## 2022-08-05 NOTE — ED Triage Notes (Addendum)
Pt c/o left side neck pain x 1 week. Pt denies any injury. She has pain with movement and reports taking tylenol for pain relief.

## 2022-08-20 ENCOUNTER — Telehealth: Payer: Self-pay | Admitting: Physician Assistant

## 2022-08-20 MED ORDER — BACLOFEN 10 MG PO TABS: 10.0000 mg | ORAL_TABLET | Freq: Two times a day (BID) | ORAL | 0 refills | Status: AC | PRN

## 2022-08-20 MED ORDER — MELOXICAM 7.5 MG PO TABS: 7.5000 mg | ORAL_TABLET | Freq: Every day | ORAL | 0 refills | Status: AC | PRN

## 2022-08-20 NOTE — Telephone Encounter (Signed)
Patient seen in this department 7/1 by myself for strain of neck and neck pain.  Patient reported improvement with meloxicam and baclofen and request refills today.  1 refill of each medication provided.  If patient needs further refills she will need to make an appointment with [place] or PCP.

## 2022-11-28 ENCOUNTER — Encounter: Payer: Self-pay | Admitting: Orthopedic Surgery

## 2022-11-28 ENCOUNTER — Other Ambulatory Visit: Payer: Self-pay | Admitting: Orthopedic Surgery

## 2022-11-28 DIAGNOSIS — M4802 Spinal stenosis, cervical region: Secondary | ICD-10-CM

## 2022-11-29 ENCOUNTER — Ambulatory Visit
Admission: RE | Admit: 2022-11-29 | Discharge: 2022-11-29 | Disposition: A | Discharge status: Home / Self Care | Payer: Medicare Other | Source: Ambulatory Visit | Attending: Orthopedic Surgery | Admitting: Orthopedic Surgery

## 2022-11-29 DIAGNOSIS — M4802 Spinal stenosis, cervical region: Secondary | ICD-10-CM

## 2022-12-27 ENCOUNTER — Other Ambulatory Visit: Payer: Self-pay | Admitting: Family Medicine

## 2022-12-27 DIAGNOSIS — Z1231 Encounter for screening mammogram for malignant neoplasm of breast: Secondary | ICD-10-CM

## 2023-02-03 ENCOUNTER — Ambulatory Visit
Admission: RE | Admit: 2023-02-03 | Discharge: 2023-02-03 | Disposition: A | Discharge status: Home / Self Care | Payer: Medicare Other | Source: Ambulatory Visit | Attending: Family Medicine | Admitting: Family Medicine

## 2023-02-03 DIAGNOSIS — Z1231 Encounter for screening mammogram for malignant neoplasm of breast: Secondary | ICD-10-CM | POA: Diagnosis present

## 2023-09-05 ENCOUNTER — Other Ambulatory Visit: Payer: Self-pay | Admitting: Family Medicine

## 2023-09-05 DIAGNOSIS — Z1231 Encounter for screening mammogram for malignant neoplasm of breast: Secondary | ICD-10-CM

## 2023-09-08 ENCOUNTER — Other Ambulatory Visit: Payer: Self-pay | Admitting: Family Medicine

## 2023-09-08 DIAGNOSIS — Z780 Asymptomatic menopausal state: Secondary | ICD-10-CM

## 2024-02-04 ENCOUNTER — Ambulatory Visit
Admission: RE | Admit: 2024-02-04 | Discharge: 2024-02-04 | Disposition: A | Discharge status: Home / Self Care | Source: Ambulatory Visit | Attending: Family Medicine | Admitting: Family Medicine

## 2024-02-04 ENCOUNTER — Encounter

## 2024-02-04 ENCOUNTER — Other Ambulatory Visit

## 2024-02-04 DIAGNOSIS — Z1231 Encounter for screening mammogram for malignant neoplasm of breast: Secondary | ICD-10-CM | POA: Diagnosis present

## 2024-02-04 DIAGNOSIS — Z780 Asymptomatic menopausal state: Secondary | ICD-10-CM | POA: Diagnosis present
